# Patient Record
Sex: Female | Born: 1995 | Race: White | Hispanic: No | Marital: Single | Smoking: Never smoker
Health system: Southern US, Community
[De-identification: ages and names within clinical notes are randomized; demographics above are authoritative.]

---
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
## 2018-07-07 ENCOUNTER — Other Ambulatory Visit: Payer: Self-pay

## 2018-07-07 ENCOUNTER — Emergency Department (HOSPITAL_COMMUNITY)
Admission: EM | Admit: 2018-07-07 | Discharge: 2018-07-08 | Disposition: A | Discharge status: Home / Self Care | Payer: BLUE CROSS/BLUE SHIELD | Attending: Emergency Medicine | Admitting: Emergency Medicine

## 2018-07-07 DIAGNOSIS — R110 Nausea: Secondary | ICD-10-CM | POA: Diagnosis not present

## 2018-07-07 DIAGNOSIS — R630 Anorexia: Secondary | ICD-10-CM | POA: Diagnosis not present

## 2018-07-07 DIAGNOSIS — K5900 Constipation, unspecified: Secondary | ICD-10-CM | POA: Diagnosis not present

## 2018-07-07 DIAGNOSIS — R1084 Generalized abdominal pain: Secondary | ICD-10-CM

## 2018-07-07 LAB — CBC
HCT: 43 % (ref 36.0–46.0)
Hemoglobin: 14.8 g/dL (ref 12.0–15.0)
MCH: 30.5 pg (ref 26.0–34.0)
MCHC: 34.4 g/dL (ref 30.0–36.0)
MCV: 88.7 fL (ref 78.0–100.0)
PLATELETS: 251 10*3/uL (ref 150–400)
RBC: 4.85 MIL/uL (ref 3.87–5.11)
RDW: 11.6 % (ref 11.5–15.5)
WBC: 6.9 10*3/uL (ref 4.0–10.5)

## 2018-07-07 LAB — COMPREHENSIVE METABOLIC PANEL
ALT: 13 U/L (ref 0–44)
AST: 22 U/L (ref 15–41)
Albumin: 4.1 g/dL (ref 3.5–5.0)
Alkaline Phosphatase: 48 U/L (ref 38–126)
Anion gap: 7 (ref 5–15)
BILIRUBIN TOTAL: 0.5 mg/dL (ref 0.3–1.2)
BUN: 9 mg/dL (ref 6–20)
CALCIUM: 9.3 mg/dL (ref 8.9–10.3)
CO2: 26 mmol/L (ref 22–32)
CREATININE: 0.62 mg/dL (ref 0.44–1.00)
Chloride: 106 mmol/L (ref 98–111)
GFR calc Af Amer: >60 mL/min (ref >60)
Glucose, Bld: 93 mg/dL (ref 70–99)
Potassium: 4.2 mmol/L (ref 3.5–5.1)
Sodium: 139 mmol/L (ref 135–145)
TOTAL PROTEIN: 7.9 g/dL (ref 6.5–8.1)

## 2018-07-07 LAB — URINALYSIS, ROUTINE W REFLEX MICROSCOPIC
Bilirubin Urine: NEGATIVE
GLUCOSE, UA: NEGATIVE mg/dL
Hgb urine dipstick: NEGATIVE
KETONES UR: NEGATIVE mg/dL
LEUKOCYTES UA: NEGATIVE
NITRITE: NEGATIVE
PROTEIN: NEGATIVE mg/dL
Specific Gravity, Urine: 1.021 (ref 1.005–1.030)
pH: 7 (ref 5.0–8.0)

## 2018-07-07 LAB — I-STAT BETA HCG BLOOD, ED (MC, WL, AP ONLY): I-stat hCG, quantitative: <5 m[IU]/mL (ref <5)

## 2018-07-07 LAB — LIPASE, BLOOD: LIPASE: 36 U/L (ref 11–51)

## 2018-07-07 NOTE — ED Triage Notes (Signed)
Patient c/o abd pain for over a month, worsening today,. Patient states when she tries to pass flatus that it is painful. Also states that she pooped today, but it was only "tiny hard balls".

## 2018-07-08 ENCOUNTER — Emergency Department (HOSPITAL_COMMUNITY): Payer: BLUE CROSS/BLUE SHIELD

## 2018-07-08 MED ORDER — POLYETHYLENE GLYCOL 3350 17 GM/SCOOP PO POWD: 17.0000 g | Freq: Two times a day (BID) | ORAL | 0 refills | Status: DC

## 2018-07-08 NOTE — ED Provider Notes (Signed)
[facility] EMERGENCY [facility]PARTMENT Provider Note   CSN: [id number] Arrival date & time: 07/07/18  2140     History   Chief Complaint Chief Complaint  Patient presents with  . Abdominal Pain    HPI Sheila Weaver is a 22 y.o. female.  The history is provided by the patient and medical records.     22 y.o. F presenting to the ED with abdominal pain.  Reports is been ongoing over the past month.  States initially was around her mid abdomen, now more so lower abdomen.  States it seems to move around and is not localized to one area in particular.  States she has decreased appetite and nausea but denies vomiting.  Has been having trouble with bowel movements, around noon today she was able to go a little bit but states it was a small "hard ball" and she had to strain quite a bit in order to go.  Denies any issues with constipation in the past.  She has not tried any medications for her symptoms.  No past medical history on file.  There are no active problems to display for this patient.    OB History   None      Home Medications    Prior to Admission medications   Not on File    Family History No family history on file.  Social History Social History   Tobacco Use  . Smoking status: Not on file  Substance Use Topics  . Alcohol use: Not on file  . Drug use: Not on file     Allergies   Sulfate   Review of Systems Review of Systems  Gastrointestinal: Positive for abdominal pain, constipation and nausea.  All other systems reviewed and are negative.    Physical Exam Updated Vital Signs BP (!) 138/95 (BP Location: Right Arm)   Pulse (!) 183   Temp 99 F (37.2 C) (Oral)   Resp 12   Ht 5\' 4"  (1.626 m)   Wt 63.5 kg   LMP 05/23/2018 (Exact Date)   SpO2 100%   BMI 24.03 kg/m   Physical Exam  Constitutional: She is oriented to person, place, and time. She appears well-developed and well-nourished.  HENT:  Head: Normocephalic and  atraumatic.  Mouth/Throat: Oropharynx is clear and moist.  Eyes: Pupils are equal, round, and reactive to light. Conjunctivae and EOM are normal.  Neck: Normal range of motion.  Cardiovascular: Normal rate, regular rhythm and normal heart sounds.  Pulmonary/Chest: Effort normal and breath sounds normal.  Abdominal: Soft. Bowel sounds are normal. There is no tenderness. There is no rigidity and no guarding.  Musculoskeletal: Normal range of motion.  Neurological: She is alert and oriented to person, place, and time.  Skin: Skin is warm and dry.  Psychiatric: She has a normal mood and affect.  Nursing note and vitals reviewed.    ED Treatments / Results  Labs (all labs ordered are listed, but only abnormal results are displayed) Labs Reviewed  LIPASE, BLOOD  COMPREHENSIVE METABOLIC PANEL  CBC  URINALYSIS, ROUTINE W REFLEX MICROSCOPIC  I-STAT BETA HCG BLOOD, ED ([facility], [facility], [facility] ONLY)    EKG None  Radiology Dg Abd Acute W/chest  Result Date: 07/08/2018 CLINICAL DATA:  Patient c/o abd pain for over a month, worsening today,. Patient states when she tries to pass flatus that it is painful. Also states that she pooped today, but it was only "tiny hard balls". EXAM: DG ABDOMEN ACUTE W/ 1V  CHEST COMPARISON:  None. FINDINGS: Frontal view of the chest demonstrates midline trachea. Normal heart size and mediastinal contours. No pleural effusion or pneumothorax. Clear lungs. Abdominal films demonstrate no free intraperitoneal air or significant air-fluid levels on upright positioning. Moderate amount of stool throughout the colon. No gaseous distention of small bowel loops on supine imaging. No abnormal abdominal calcifications. No appendicolith. Low pelvis excluded. Minimal S-shaped thoracolumbar spine curvature. IMPRESSION: No acute findings. Possible constipation. Electronically Signed   By: Jeronimo Greaves M.D.   On: 07/08/2018 01:32    Procedures Procedures (including critical care  time)  Medications Ordered in ED Medications - No data to display   Initial Impression / Assessment and Plan / ED Course  I have reviewed the triage vital signs and the nursing notes.  Pertinent labs & imaging results that were available during my care of the patient were reviewed by me and considered in my medical decision making (see chart for details).  22 year old female here with abdominal pain over the past month.  Seems generalized now.  Has had some nausea and decreased appetite but denies vomiting or diarrhea.  Some difficulty with bowel movements.  Likely seems like she is constipated.  Labs are reassuring.  X-ray confirms constipation, no signs of obstruction or free air to suggest perforation.  Will start on bowel regimen with MiraLAX.  Discussed good oral hydration, increased fiber.  Can follow-up with PCP-- no one listed at present, is insured with [employer] so given instructions how to find/contact local offices.  Discussed plan with patient, she acknowledged understanding and agreed with plan of care.  Return precautions given for new or worsening symptoms.  Final Clinical Impressions(s) / ED Diagnoses   Final diagnoses:  Generalized abdominal pain  Constipation, unspecified constipation type    ED Discharge Orders         Ordered    polyethylene glycol powder (GLYCOLAX/MIRALAX) powder  2 times daily     07/08/18 0300           Garlon Hatchet, PA-C 07/08/18 0358    Palumbo, April, MD 07/08/18 1610

## 2018-07-08 NOTE — Discharge Instructions (Signed)
Start using miralax twice daily.  Once bowel movements improve, can reduce to once a day and then just use as needed. Make sure to drink plenty of water, stay hydrated. Follow-up with your primary care doctor.  If you do not have one, call number on your insurance card to find local clinics accepting your insurance. Return here for any nwe/acute changes.

## 2018-07-08 NOTE — ED Notes (Signed)
Pt verbalized dc instructions, vss, nad. Ambulatory upon discharge.

## 2019-12-29 IMAGING — CR DG ABDOMEN ACUTE W/ 1V CHEST
3 series · 3 of 3 positions shown · non-contrast
Comparison: None.

CLINICAL DATA: Patient c/o abd pain for over a month, worsening
today,. Patient states when she tries to pass flatus that it is
painful. Also states that she pooped today, but it was only "tiny
hard balls".

EXAM:
DG ABDOMEN ACUTE W/ 1V CHEST

[chest pa]
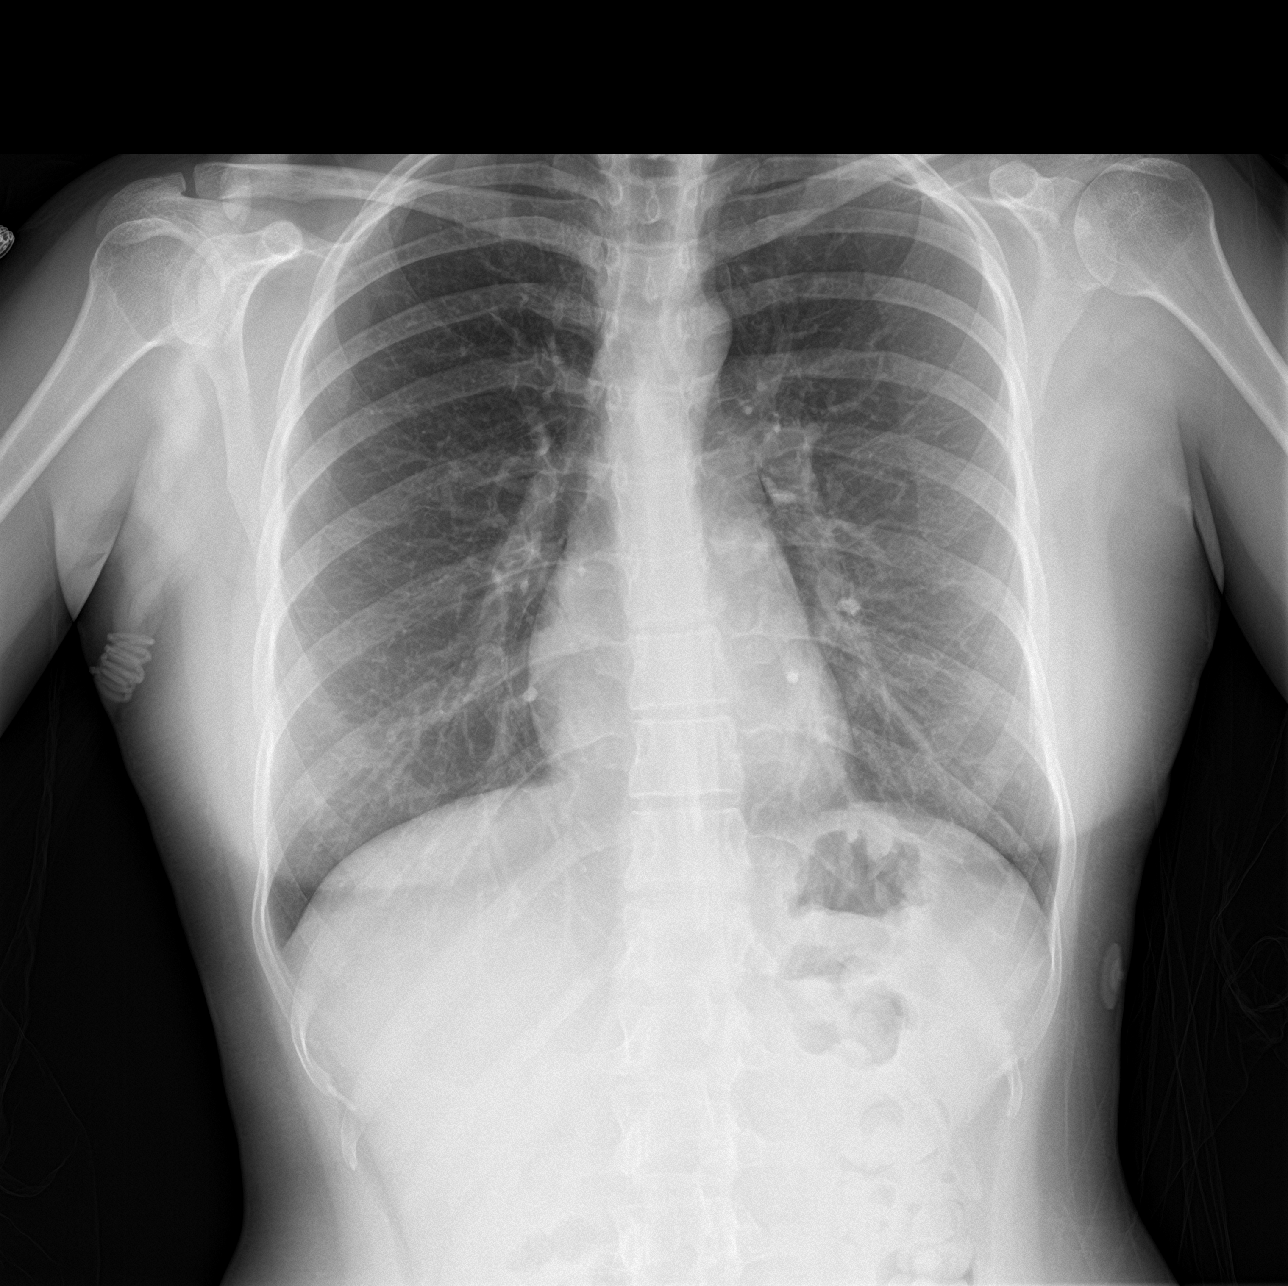

[abdomen erect]
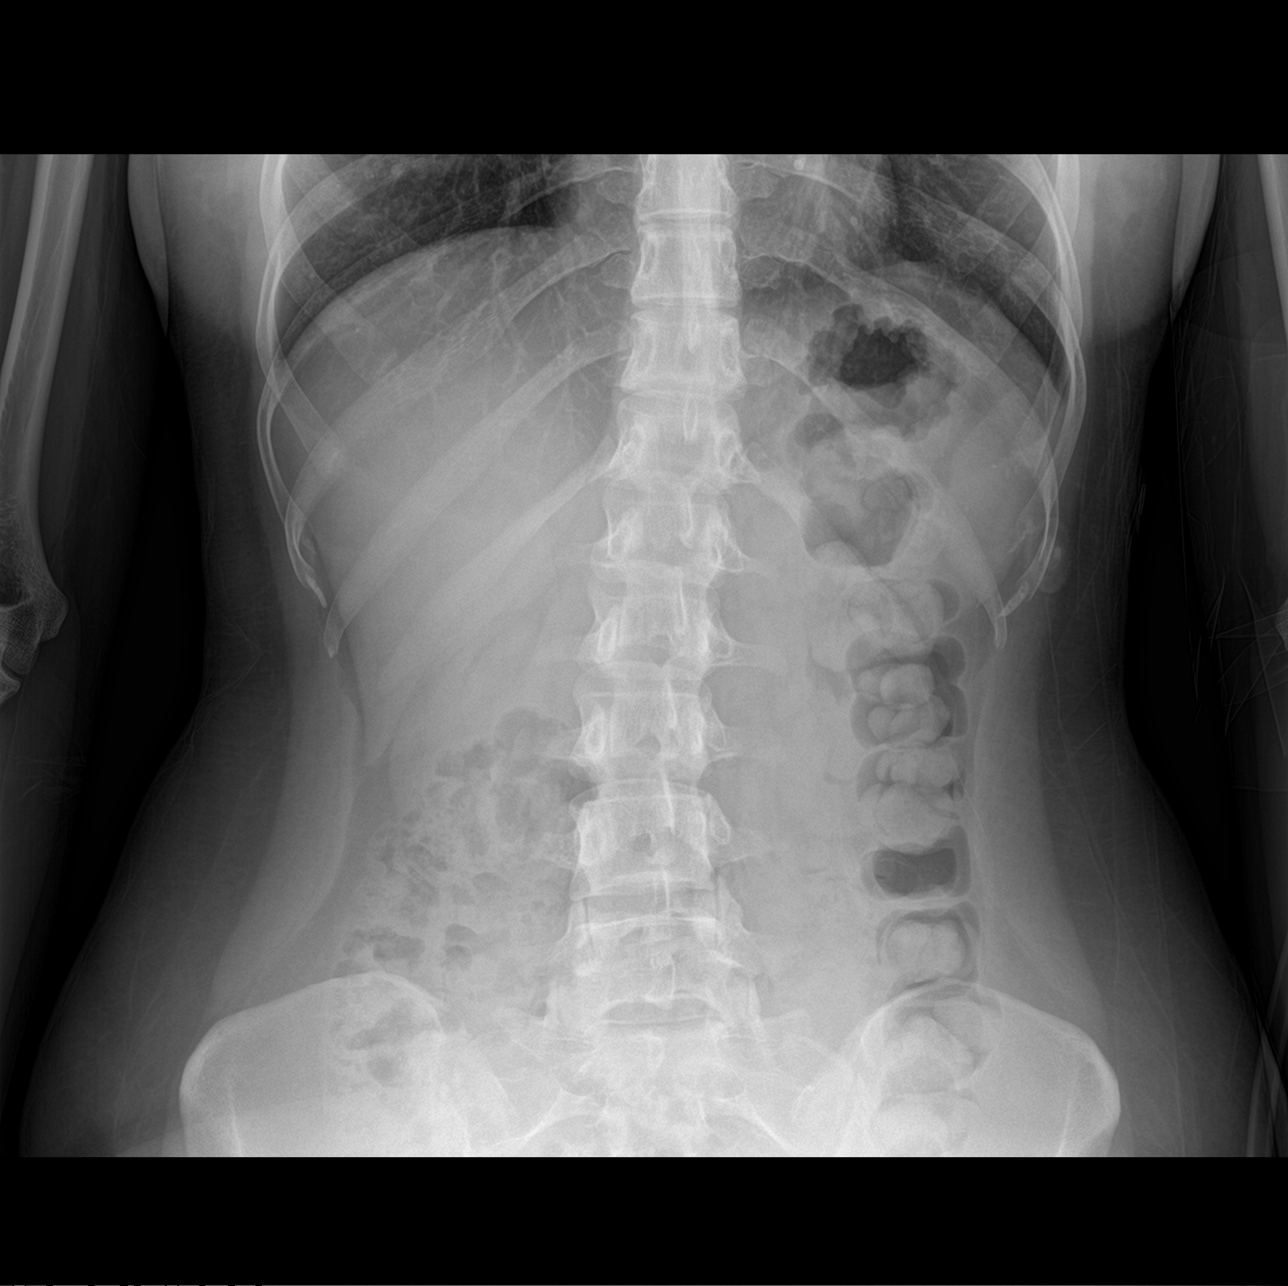

[abdomen supine]
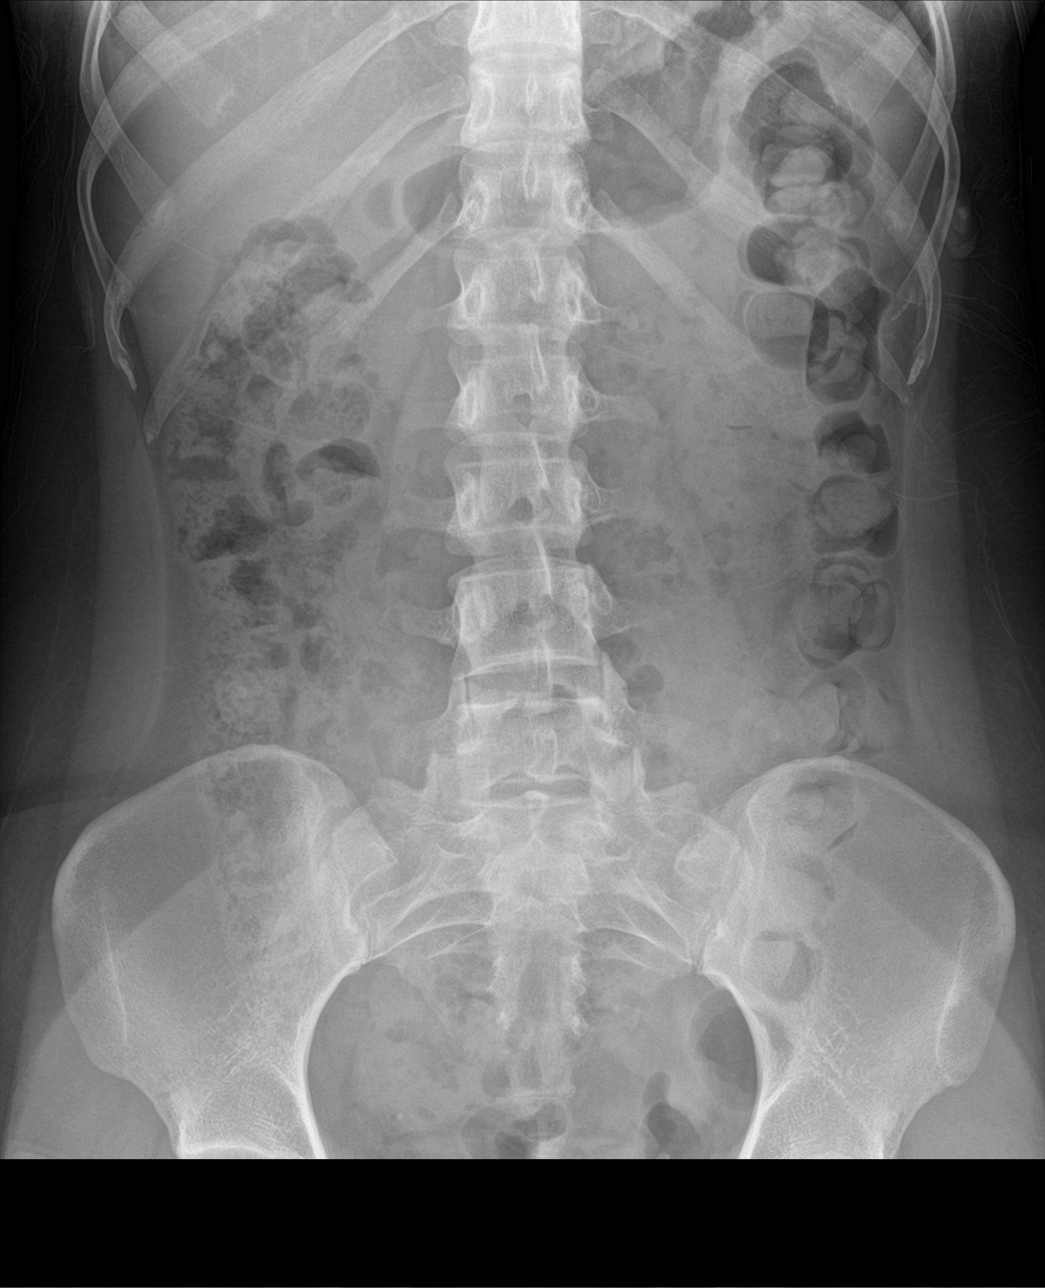

[3 of 3 positions shown; findings below may reference images not displayed]

FINDINGS: Frontal view of the chest demonstrates midline trachea. Normal heart
size and mediastinal contours. No pleural effusion or pneumothorax.
Clear lungs.

Abdominal films demonstrate no free intraperitoneal air or
significant air-fluid levels on upright positioning. Moderate amount
of stool throughout the colon. No gaseous distention of small bowel
loops on supine imaging. No abnormal abdominal calcifications. No
appendicolith. Low pelvis excluded. Minimal S-shaped thoracolumbar
spine curvature.
IMPRESSION: No acute findings.

Possible constipation.

## 2021-05-11 ENCOUNTER — Ambulatory Visit (INDEPENDENT_AMBULATORY_CARE_PROVIDER_SITE_OTHER): Payer: BC Managed Care – PPO | Admitting: Allergy and Immunology

## 2021-05-11 ENCOUNTER — Other Ambulatory Visit: Payer: Self-pay

## 2021-05-11 ENCOUNTER — Encounter: Payer: Self-pay | Admitting: Allergy and Immunology

## 2021-05-11 VITALS — BP 128/90 | HR 94 | Temp 98.3°F | Resp 18 | Ht 64.0 in | Wt 169.0 lb

## 2021-05-11 DIAGNOSIS — J3089 Other allergic rhinitis: Secondary | ICD-10-CM

## 2021-05-11 DIAGNOSIS — F458 Other somatoform disorders: Secondary | ICD-10-CM | POA: Diagnosis not present

## 2021-05-11 DIAGNOSIS — T7840XA Allergy, unspecified, initial encounter: Secondary | ICD-10-CM

## 2021-05-11 NOTE — Patient Instructions (Addendum)
  1.  Allergen avoidance measures?  2.  Treat and prevent inflammation:  A. OTC Nasacort - 1-2 sprays each nostril 3-7 times per week B. Montelukast 10 mg - 1 tablet 1 time per day  3. If needed:  A. Loratadine 10 mg - 1-2 tablets 1 time per day  4.  Recognize hyperventilation and consider rebreathing technique  5.  Slowly eliminate all caffeine consumption to prevent migraine  6.  Return to clinic in 4 weeks or earlier if problem

## 2021-05-11 NOTE — Progress Notes (Signed)
[place] - [place] - [place] - [place] - [place]   NEW PATIENT NOTE  Referring Provider: No ref. provider found Primary Provider: Patient, No Pcp Per (Inactive) Date of office visit: 05/11/2021    Subjective:   Chief Complaint:  Sheila Weaver (DOB: 07/24/1996) is a 25 y.o. female who presents to the clinic on 05/11/2021 with a chief complaint of Allergy Testing (Wants to check to see if she is allergic to mints. States that she has always been allergic to mints ut it has gotten worse over the years even the smell caused her airway to be restricted) .     HPI: Sheila Weaver presents to this clinic in evaluation of allergic reactions.  For the past 5 years she will develop a reaction when she is exposed to any type of mint smell or menthol smell.  This occurs with scent from toothpaste or mouthwash or chewing gum or other sources.  She will develop eye burning and mouth burning and nasal burning and then this progresses to the sensation of throat constriction and she finds it hard to breathe in and out and she ends up taking really deep breaths as though she has some air hunger and she develops a headache..  She does not really have any other associated systemic or constitutional symptoms although she finds it very hard to talk as though she cannot form words when this occurs.  She will try to remove herself from the scent and usually her reaction lasts about 1 hour.  She usually takes a Benadryl with this issue which may or may not help.  With manipulation of the environment this is only occurring about 1 time per month at this point.  In addition, she also does have a problem with nasal congestion and sneezing and some itchy eyes that appears to occur during the late spring season of the year.  She can't take Allegra because it produces vertigo.  Xyzal and Zyrtec creates a migraine.  Claritin is of no help.  Most of her trigger for her upper airway and eye issue appears to be outdoor  exposure.  She also has migraines.  She has clusters of migraines that can go on for weeks and then she will not have any migraines for a month or so and then she can have more migraines.  This involves her right parietal and occipital region and is pounding and aching and is associated with dizziness and some nausea and she usually tries to lay down in a dark room for relief at the same time she takes Tylenol.  And she has problems with initiation insomnia.  It takes her 1/2 to 2 hours to fall asleep at nighttime.  When she is asleep she can maintain sleep with no problem.  She consumes about 12 ounces of [facility] every day and rarely has some chocolate.  History reviewed. No pertinent past medical history.  Past Surgical History:  Procedure Laterality Date   WISDOM TOOTH EXTRACTION N/A 2014    Allergies as of 05/11/2021       Reactions   Allegra [fexofenadine] Other (See Comments)   Severe Vertigo   Cymbalta [duloxetine Hcl] Other (See Comments)   Manic episode   Xyzal [levocetirizine] Other (See Comments)   Bad Headache   Sulfate Rash   Zyrtec [cetirizine] Other (See Comments)   Headache        Medication List    multivitamin with minerals tablet Take 1 tablet by mouth daily.   NONFORMULARY  OR COMPOUNDED ITEM Take 5 mg by mouth daily. Take one CBD tablet by mouth daily        Review of systems negative except as noted in HPI / PMHx or noted below:  Review of Systems  Constitutional: Negative.   HENT: Negative.    Eyes: Negative.   Respiratory: Negative.    Cardiovascular: Negative.   Gastrointestinal: Negative.   Genitourinary: Negative.   Musculoskeletal: Negative.   Skin: Negative.   Neurological: Negative.   Endo/Heme/Allergies: Negative.   Psychiatric/Behavioral: Negative.     History reviewed. No pertinent family history.  Social History   Socioeconomic History   Marital status: Single    Spouse name: Not on file   Number of children: Not  on file   Years of education: Not on file   Highest education level: Not on file  Occupational History   Not on file  Tobacco Use   Smoking status: Never   Smokeless tobacco: Never  Substance and Sexual Activity   Alcohol use: Yes    Alcohol/week: 2.0 standard drinks    Types: 2 Standard drinks or equivalent per week   Drug use: Not on file   Sexual activity: Not on file  Other Topics Concern   Not on file  Social History Narrative   Not on file   Environmental and Social history  Lives in a house with a dry environment, a dog located inside the household, no carpet in the bedroom, no plastic on the bed, no plastic on the pillow, no smoking ongoing with inside the household.  She works as a Network engineer.  Objective:   Vitals:   05/11/21 1010  BP: 128/90  Pulse: 94  Resp: 18  Temp: 98.3 F (36.8 C)  SpO2: 96%   Height: 5\' 4"  (162.6 cm) Weight: 169 lb (76.7 kg)  Physical Exam Constitutional:      Appearance: She is not diaphoretic.  HENT:     Head: Normocephalic.     Right Ear: Tympanic membrane, ear canal and external ear normal.     Left Ear: Tympanic membrane, ear canal and external ear normal.     Nose: Nose normal. No mucosal edema or rhinorrhea.     Mouth/Throat:     Pharynx: Uvula midline. No oropharyngeal exudate.  Eyes:     Conjunctiva/sclera: Conjunctivae normal.  Neck:     Thyroid: No thyromegaly.     Trachea: Trachea normal. No tracheal tenderness or tracheal deviation.  Cardiovascular:     Rate and Rhythm: Normal rate and regular rhythm.     Heart sounds: Normal heart sounds, S1 normal and S2 normal. No murmur heard. Pulmonary:     Effort: No respiratory distress.     Breath sounds: Normal breath sounds. No stridor. No wheezing or rales.  Lymphadenopathy:     Head:     Right side of head: No tonsillar adenopathy.     Left side of head: No tonsillar adenopathy.     Cervical: No cervical adenopathy.  Skin:    Findings: No erythema or  rash.     Nails: There is no clubbing.  Neurological:     Mental Status: She is alert.    Diagnostics: Allergy skin tests were performed.  She did not demonstrate any hypersensitivity against a screening panel of aeroallergens or foods.  Spirometry was performed and demonstrated an FEV1 of 2.63 @ 81 % of predicted. FEV1/FVC = 0.85   Assessment and Plan:    1. Hyperventilation syndrome  2. Multiple chemical sensitivity syndrome, initial encounter   3. Seasonal allergic rhinitis due to other allergic trigger     1.  Allergen avoidance measures?  2.  Treat and prevent inflammation:  A. OTC Nasacort - 1-2 sprays each nostril 3-7 times per week B. Montelukast 10 mg - 1 tablet 1 time per day  3. If needed:  A. Loratadine 10 mg - 1-2 tablets 1 time per day  4.  Recognize hyperventilation and consider rebreathing technique  5.  Slowly eliminate all caffeine consumption to prevent migraine  6.  Return to clinic in 4 weeks or earlier if problem  It appears that Laurelai has some type of chemical sensitivity to various scents that gives rise to a hyperventilation syndrome and the subsequent symptoms that she experiences with these exposures.  I tried to explain to her about chemical sensitivity and hyperventilation and using a rebreathing technique when she does experience this issue.  She became somewhat tearful about the situation and I am not really sure if I was able to explain to her in detail with a good understanding of her situation.  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  She also has migraines and I asked her to eliminate all of her caffeine consumption which will probably decrease the intensity and frequency of those headaches and during the spring when she does develop problems with rhinitis she can use anti-inflammatory agents for her airway as noted above.  Jessica Priest, MD Allergy / Immunology [facility]

## 2021-05-12 ENCOUNTER — Encounter: Payer: Self-pay | Admitting: Allergy and Immunology

## 2021-05-12 MED ORDER — TRIAMCINOLONE ACETONIDE 55 MCG/ACT NA AERO: 2.0000 | INHALATION_SPRAY | Freq: Every day | NASAL | 5 refills | Status: AC

## 2021-05-12 MED ORDER — LORATADINE 10 MG PO TABS: 10.0000 mg | ORAL_TABLET | Freq: Two times a day (BID) | ORAL | 5 refills | Status: AC | PRN

## 2021-05-12 MED ORDER — MONTELUKAST SODIUM 10 MG PO TABS: 10.0000 mg | ORAL_TABLET | Freq: Every day | ORAL | 5 refills | Status: AC

## 2021-05-13 ENCOUNTER — Telehealth: Payer: Self-pay

## 2021-05-13 NOTE — Telephone Encounter (Signed)
Patient needs a diagnosis so that I can scan their Cleda Daub

## 2021-05-13 NOTE — Addendum Note (Signed)
Addended by: Orson Aloe on: 05/13/2021 02:21 PM   Modules accepted: Orders

## 2021-05-13 NOTE — Telephone Encounter (Signed)
Thank you :)

## 2021-05-13 NOTE — Addendum Note (Signed)
Addended by: Orson Aloe on: 05/13/2021 03:14 PM   Modules accepted: Orders

## 2021-06-09 ENCOUNTER — Encounter: Payer: Self-pay | Admitting: Allergy and Immunology

## 2021-06-09 ENCOUNTER — Other Ambulatory Visit: Payer: Self-pay

## 2021-06-09 ENCOUNTER — Ambulatory Visit: Payer: BC Managed Care – PPO | Admitting: Allergy and Immunology

## 2021-06-09 VITALS — BP 124/88 | HR 105 | Temp 97.7°F | Resp 12

## 2021-06-09 DIAGNOSIS — J3089 Other allergic rhinitis: Secondary | ICD-10-CM

## 2021-06-09 DIAGNOSIS — T7840XD Allergy, unspecified, subsequent encounter: Secondary | ICD-10-CM

## 2021-06-09 DIAGNOSIS — J383 Other diseases of vocal cords: Secondary | ICD-10-CM

## 2021-06-09 DIAGNOSIS — F458 Other somatoform disorders: Secondary | ICD-10-CM

## 2021-06-09 DIAGNOSIS — J387 Other diseases of larynx: Secondary | ICD-10-CM

## 2021-06-09 MED ORDER — CYPROHEPTADINE HCL 4 MG PO TABS: ORAL_TABLET | ORAL | 5 refills | Status: AC

## 2021-06-09 NOTE — Patient Instructions (Addendum)
  1.  Blood - mint family IgE  2.  Start periactin 4mg  tablet - 1/2 tablet at bedtime  3.  Recognize hyperventilation and consider rebreathing technique  4.  Continue to eliminate all caffeine consumption   5.  Return to clinic in 4 weeks or earlier if problem

## 2021-06-09 NOTE — Progress Notes (Signed)
[place] - [place] - [place] - [place] - [place]   Follow-up Note  Referring Provider: No ref. provider found Primary Provider: Patient, No Pcp Per (Inactive) Date of Office Visit: 06/09/2021  Subjective:   Sheila Weaver (DOB: April 10, 1996) is a 25 y.o. female who [facility] in re-evaluation of the following:  HPI: Sheila Weaver returns to this clinic in reevaluation of hyperventilation syndrome, multiple chemical sensitivity syndrome, irritable [place] syndrome, and seasonal allergic rhinitis.  Her last visit to this clinic was her initial evaluation of 11 May 2021.  She still has problems with mint exposure.  She was walking down the aisle at the department store and smelled mint in her throat immediately closed up.  She left that area and she had irritation of her throat for about 30 minutes.  At this point she is not really having any problems with her nose.  She is not using any antihistamines.  She is not using any montelukast.  She is not using any nasal steroid.  She has tapered off all caffeine and she has had continued headaches in the form of migraines about 1 or 2 times per week.  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  Allergies as of 06/09/2021       Reactions   Allegra [fexofenadine] Other (See Comments)   Severe Vertigo   Cymbalta [duloxetine Hcl] Other (See Comments)   Manic episode   Xyzal [levocetirizine] Other (See Comments)   Bad Headache   Sulfate Rash   Zyrtec [cetirizine] Other (See Comments)   Headache        Medication List    cyproheptadine 4 MG tablet Commonly known as: PERIACTIN Take half a tablet at bedtime Started by: Arpi Diebold Claudia Pollock, MD   loratadine 10 MG tablet Commonly known as: Claritin Take 1 tablet (10 mg total) by mouth 2 (two) times daily as needed for allergies.   montelukast 10 MG tablet Commonly known as: SINGULAIR Take 1 tablet (10 mg total) by mouth at bedtime.   multivitamin  with minerals tablet Take 1 tablet by mouth daily.   NONFORMULARY OR COMPOUNDED ITEM Take 5 mg by mouth daily. Take one CBD tablet by mouth daily   triamcinolone 55 MCG/ACT Aero nasal inhaler Commonly known as: NASACORT Place 2 sprays into the nose daily.        History reviewed. No pertinent past medical history.  Past Surgical History:  Procedure Laterality Date   WISDOM TOOTH EXTRACTION N/A 2014    Review of systems negative except as noted in HPI / PMHx or noted below:  Review of Systems  Constitutional: Negative.   HENT: Negative.    Eyes: Negative.   Respiratory: Negative.    Cardiovascular: Negative.   Gastrointestinal: Negative.   Genitourinary: Negative.   Musculoskeletal: Negative.   Skin: Negative.   Neurological: Negative.   Endo/Heme/Allergies: Negative.   Psychiatric/Behavioral: Negative.      Objective:   Vitals:   06/09/21 1610  BP: 124/88  Pulse: (!) 105  Resp: 12  Temp: 97.7 F (36.5 C)  SpO2: 99%          Physical Exam-deferred  Diagnostics: none  Assessment and Plan:   1. Multiple chemical sensitivity syndrome, subsequent encounter   2. Irritable larynx syndrome   3. Vocal cord dysfunction   4. Hyperventilation syndrome   5. Seasonal allergic rhinitis due to other allergic trigger     1.  Blood - mint family IgE  2.  Start periactin 4mg  tablet - 1/2 tablet at bedtime  3.  Recognize hyperventilation and consider rebreathing technique  4.  Continue to eliminate all caffeine consumption   5.  Return to clinic in 4 weeks or earlier if problem  Sheila Weaver is firmly convinced that she is allergic to Mint and we will check a Mint IgE level and I will contact her with the results of that study once it is available for review.  I am going to start her on Periactin to help with her headaches, sleep dysfunction, and possibly to raise her threshold for irritation of her airway following exposure to various irritants.  I will regroup with  her in 4 weeks to assess her response.  Nehemiah Settle, MD Allergy / Immunology [facility]

## 2021-06-19 LAB — F332-IGE MINT: Mint IgE: <0.1 kU/L
